# Patient Record
Sex: Female | Born: 1945 | Race: White | Hispanic: No | Marital: Married | Smoking: Never smoker
Health system: Southern US, Community
[De-identification: ages and names within clinical notes are randomized; demographics above are authoritative.]

## PROBLEM LIST (undated history)

## (undated) DIAGNOSIS — F329 Major depressive disorder, single episode, unspecified: Secondary | ICD-10-CM

## (undated) DIAGNOSIS — F32A Depression, unspecified: Secondary | ICD-10-CM

## (undated) DIAGNOSIS — Z8719 Personal history of other diseases of the digestive system: Secondary | ICD-10-CM

## (undated) DIAGNOSIS — E039 Hypothyroidism, unspecified: Secondary | ICD-10-CM

## (undated) DIAGNOSIS — K8020 Calculus of gallbladder without cholecystitis without obstruction: Secondary | ICD-10-CM

## (undated) DIAGNOSIS — K219 Gastro-esophageal reflux disease without esophagitis: Secondary | ICD-10-CM

## (undated) DIAGNOSIS — I10 Essential (primary) hypertension: Secondary | ICD-10-CM

## (undated) DIAGNOSIS — I447 Left bundle-branch block, unspecified: Secondary | ICD-10-CM

## (undated) DIAGNOSIS — F419 Anxiety disorder, unspecified: Secondary | ICD-10-CM

## (undated) HISTORY — PX: RHINOPLASTY: SHX2354

## (undated) HISTORY — PX: ANAL FISSURE REPAIR: SHX2312

---

## 1998-03-02 ENCOUNTER — Ambulatory Visit (HOSPITAL_COMMUNITY): Admission: RE | Admit: 1998-03-02 | Discharge: 1998-03-02 | Payer: Self-pay | Admitting: Obstetrics and Gynecology

## 1998-06-12 DIAGNOSIS — I447 Left bundle-branch block, unspecified: Secondary | ICD-10-CM

## 1998-06-12 HISTORY — DX: Left bundle-branch block, unspecified: I44.7

## 1998-09-20 ENCOUNTER — Emergency Department (HOSPITAL_COMMUNITY): Admission: EM | Admit: 1998-09-20 | Discharge: 1998-09-20 | Payer: Self-pay

## 1999-03-23 ENCOUNTER — Encounter: Payer: Self-pay | Admitting: General Surgery

## 1999-03-24 ENCOUNTER — Ambulatory Visit (HOSPITAL_COMMUNITY): Admission: RE | Admit: 1999-03-24 | Discharge: 1999-03-24 | Payer: Self-pay | Admitting: General Surgery

## 1999-04-21 ENCOUNTER — Encounter: Payer: Self-pay | Admitting: Internal Medicine

## 1999-04-21 ENCOUNTER — Ambulatory Visit (HOSPITAL_COMMUNITY): Admission: RE | Admit: 1999-04-21 | Discharge: 1999-04-21 | Payer: Self-pay | Admitting: Internal Medicine

## 2000-06-27 ENCOUNTER — Encounter: Payer: Self-pay | Admitting: Obstetrics and Gynecology

## 2000-06-27 ENCOUNTER — Ambulatory Visit (HOSPITAL_COMMUNITY): Admission: RE | Admit: 2000-06-27 | Discharge: 2000-06-27 | Payer: Self-pay | Admitting: Obstetrics and Gynecology

## 2001-12-11 ENCOUNTER — Encounter: Admission: RE | Admit: 2001-12-11 | Discharge: 2001-12-11 | Payer: Self-pay | Admitting: Internal Medicine

## 2001-12-11 ENCOUNTER — Encounter: Payer: Self-pay | Admitting: Internal Medicine

## 2001-12-18 ENCOUNTER — Encounter: Payer: Self-pay | Admitting: Internal Medicine

## 2001-12-18 ENCOUNTER — Ambulatory Visit (HOSPITAL_COMMUNITY): Admission: RE | Admit: 2001-12-18 | Discharge: 2001-12-18 | Payer: Self-pay | Admitting: Internal Medicine

## 2002-05-12 ENCOUNTER — Emergency Department (HOSPITAL_COMMUNITY): Admission: EM | Admit: 2002-05-12 | Discharge: 2002-05-12 | Payer: Self-pay | Admitting: Emergency Medicine

## 2002-05-12 ENCOUNTER — Encounter: Payer: Self-pay | Admitting: Emergency Medicine

## 2003-06-19 ENCOUNTER — Other Ambulatory Visit: Admission: RE | Admit: 2003-06-19 | Discharge: 2003-06-19 | Payer: Self-pay | Admitting: Internal Medicine

## 2003-06-25 ENCOUNTER — Encounter: Admission: RE | Admit: 2003-06-25 | Discharge: 2003-09-23 | Payer: Self-pay | Admitting: Internal Medicine

## 2004-02-04 ENCOUNTER — Ambulatory Visit (HOSPITAL_COMMUNITY): Admission: RE | Admit: 2004-02-04 | Discharge: 2004-02-04 | Payer: Self-pay | Admitting: Gastroenterology

## 2004-04-13 ENCOUNTER — Ambulatory Visit (HOSPITAL_COMMUNITY): Admission: RE | Admit: 2004-04-13 | Discharge: 2004-04-13 | Payer: Self-pay | Admitting: Internal Medicine

## 2006-02-07 ENCOUNTER — Other Ambulatory Visit: Admission: RE | Admit: 2006-02-07 | Discharge: 2006-02-07 | Payer: Self-pay | Admitting: Internal Medicine

## 2006-02-14 ENCOUNTER — Ambulatory Visit (HOSPITAL_COMMUNITY): Admission: RE | Admit: 2006-02-14 | Discharge: 2006-02-14 | Payer: Self-pay | Admitting: Internal Medicine

## 2007-09-05 ENCOUNTER — Other Ambulatory Visit: Admission: RE | Admit: 2007-09-05 | Discharge: 2007-09-05 | Payer: Self-pay | Admitting: Family Medicine

## 2008-03-05 ENCOUNTER — Ambulatory Visit (HOSPITAL_COMMUNITY): Admission: RE | Admit: 2008-03-05 | Discharge: 2008-03-05 | Payer: Self-pay | Admitting: Family Medicine

## 2008-03-10 ENCOUNTER — Encounter: Admission: RE | Admit: 2008-03-10 | Discharge: 2008-03-10 | Payer: Self-pay | Admitting: Family Medicine

## 2010-10-28 NOTE — Op Note (Signed)
NAME:  Crystal Owen, Crystal Owen NO.:  [id number]   MEDICAL RECORD NO.:  [id number]                   PATIENT TYPE:  AMB   LOCATION:  ENDO                                 FACILITY:  [facility]   PHYSICIAN:  Petra Kuba, M.D.                 DATE OF BIRTH:  [birth year]   DATE OF PROCEDURE:  02/04/2004  DATE OF DISCHARGE:                                 OPERATIVE REPORT   PROCEDURE:  EGD.   INDICATION:  Longstanding upper tract symptoms.  Want to rule out  significant abnormality.  Consent was signed after risks, benefits, methods,  options thoroughly discussed in the office.   MEDICINES USED:  1. Demerol 60.  2. Versed 6.   DESCRIPTION OF PROCEDURE:  The video endoscope was inserted by direct  vision.  The esophagus was normal.  In distal esophagus was a tiny hiatal  hernia.  The scope passed into the stomach, advanced through a normal  antrum, normal pylorus, into a normal duodenal bulb, and around the C-loop  to a normal second portion of the duodenum.  The scope was withdrawn back to  the bulb, and a good look there ruled out ulcers in that location.  The  scope was withdrawn back to the stomach, and retroflexed.  Angularis, fundus  were normal.  High in the cardia, the hiatal hernia was confirmed.  The  lesser and greater curve were normal on retroflexed visualization.  Straight  visualization in the stomach did not reveal any additional findings.  Air  was suctioned and scope slowly withdrawn.  Again, a good look at the  esophagus ruled out any signs of Barrett's esophagitis, etc.  The scope was  removed.  The patient tolerated the procedure well.  There was no obvious  immediate complication.   ENDOSCOPIC DIAGNOSES:  1. Tiny hiatal hernia.  2. Otherwise normal EGD.   PLAN:  1. Continue with Zantac since that seems to be helping.  2. Happy to see back p.r.n. or in 2-3 months just to recheck symptoms and     make sure no further work-up and plans are  needed.  As an aside, she is     due for colonic screening and happy to see back to set that up, or we     will discuss on follow up.                                               Petra Kuba, M.D.   MEM/MEDQ  D:  02/04/2004  T:  02/04/2004  Job:  [id number]   cc:   Darius Bump, M.D.  [place]  [place] [address]  Fax: [phone]

## 2015-06-30 DIAGNOSIS — L821 Other seborrheic keratosis: Secondary | ICD-10-CM | POA: Diagnosis not present

## 2015-06-30 DIAGNOSIS — Z23 Encounter for immunization: Secondary | ICD-10-CM | POA: Diagnosis not present

## 2015-06-30 DIAGNOSIS — L570 Actinic keratosis: Secondary | ICD-10-CM | POA: Diagnosis not present

## 2015-06-30 DIAGNOSIS — L723 Sebaceous cyst: Secondary | ICD-10-CM | POA: Diagnosis not present

## 2015-06-30 DIAGNOSIS — D225 Melanocytic nevi of trunk: Secondary | ICD-10-CM | POA: Diagnosis not present

## 2015-06-30 DIAGNOSIS — Z85828 Personal history of other malignant neoplasm of skin: Secondary | ICD-10-CM | POA: Diagnosis not present

## 2015-06-30 DIAGNOSIS — Z808 Family history of malignant neoplasm of other organs or systems: Secondary | ICD-10-CM | POA: Diagnosis not present

## 2015-08-03 DIAGNOSIS — K219 Gastro-esophageal reflux disease without esophagitis: Secondary | ICD-10-CM | POA: Diagnosis not present

## 2015-08-03 DIAGNOSIS — F411 Generalized anxiety disorder: Secondary | ICD-10-CM | POA: Diagnosis not present

## 2015-08-03 DIAGNOSIS — E039 Hypothyroidism, unspecified: Secondary | ICD-10-CM | POA: Diagnosis not present

## 2015-08-03 DIAGNOSIS — I10 Essential (primary) hypertension: Secondary | ICD-10-CM | POA: Diagnosis not present

## 2015-08-03 DIAGNOSIS — F3341 Major depressive disorder, recurrent, in partial remission: Secondary | ICD-10-CM | POA: Diagnosis not present

## 2015-08-06 DIAGNOSIS — R197 Diarrhea, unspecified: Secondary | ICD-10-CM | POA: Diagnosis not present

## 2015-08-09 DIAGNOSIS — K921 Melena: Secondary | ICD-10-CM | POA: Diagnosis not present

## 2015-08-09 DIAGNOSIS — R197 Diarrhea, unspecified: Secondary | ICD-10-CM | POA: Diagnosis not present

## 2015-08-09 DIAGNOSIS — Z8719 Personal history of other diseases of the digestive system: Secondary | ICD-10-CM | POA: Diagnosis not present

## 2015-08-26 DIAGNOSIS — K921 Melena: Secondary | ICD-10-CM | POA: Diagnosis not present

## 2015-08-26 DIAGNOSIS — Z1210 Encounter for screening for malignant neoplasm of intestinal tract, unspecified: Secondary | ICD-10-CM | POA: Diagnosis not present

## 2015-08-26 DIAGNOSIS — R1011 Right upper quadrant pain: Secondary | ICD-10-CM | POA: Diagnosis not present

## 2015-09-01 DIAGNOSIS — K5730 Diverticulosis of large intestine without perforation or abscess without bleeding: Secondary | ICD-10-CM | POA: Diagnosis not present

## 2015-09-01 DIAGNOSIS — Z1211 Encounter for screening for malignant neoplasm of colon: Secondary | ICD-10-CM | POA: Diagnosis not present

## 2015-09-14 DIAGNOSIS — H2513 Age-related nuclear cataract, bilateral: Secondary | ICD-10-CM | POA: Diagnosis not present

## 2015-09-14 DIAGNOSIS — H04123 Dry eye syndrome of bilateral lacrimal glands: Secondary | ICD-10-CM | POA: Diagnosis not present

## 2015-12-08 DIAGNOSIS — F411 Generalized anxiety disorder: Secondary | ICD-10-CM | POA: Diagnosis not present

## 2015-12-08 DIAGNOSIS — I10 Essential (primary) hypertension: Secondary | ICD-10-CM | POA: Diagnosis not present

## 2015-12-08 DIAGNOSIS — F3341 Major depressive disorder, recurrent, in partial remission: Secondary | ICD-10-CM | POA: Diagnosis not present

## 2015-12-08 DIAGNOSIS — E039 Hypothyroidism, unspecified: Secondary | ICD-10-CM | POA: Diagnosis not present

## 2015-12-08 DIAGNOSIS — K219 Gastro-esophageal reflux disease without esophagitis: Secondary | ICD-10-CM | POA: Diagnosis not present

## 2015-12-13 DIAGNOSIS — I10 Essential (primary) hypertension: Secondary | ICD-10-CM | POA: Diagnosis not present

## 2015-12-15 DIAGNOSIS — L729 Follicular cyst of the skin and subcutaneous tissue, unspecified: Secondary | ICD-10-CM | POA: Diagnosis not present

## 2016-02-07 DIAGNOSIS — Z23 Encounter for immunization: Secondary | ICD-10-CM | POA: Diagnosis not present

## 2016-02-07 DIAGNOSIS — Z0000 Encounter for general adult medical examination without abnormal findings: Secondary | ICD-10-CM | POA: Diagnosis not present

## 2016-03-23 DIAGNOSIS — R946 Abnormal results of thyroid function studies: Secondary | ICD-10-CM | POA: Diagnosis not present

## 2016-05-10 ENCOUNTER — Other Ambulatory Visit: Payer: Self-pay | Admitting: Gastroenterology

## 2016-05-10 DIAGNOSIS — K210 Gastro-esophageal reflux disease with esophagitis: Secondary | ICD-10-CM | POA: Diagnosis not present

## 2016-05-10 DIAGNOSIS — R1011 Right upper quadrant pain: Secondary | ICD-10-CM

## 2016-05-18 ENCOUNTER — Ambulatory Visit
Admission: RE | Admit: 2016-05-18 | Discharge: 2016-05-18 | Disposition: A | Discharge status: Home / Self Care | Payer: PPO | Source: Ambulatory Visit | Attending: Gastroenterology | Admitting: Gastroenterology

## 2016-05-18 DIAGNOSIS — R1011 Right upper quadrant pain: Secondary | ICD-10-CM

## 2016-05-18 DIAGNOSIS — K8020 Calculus of gallbladder without cholecystitis without obstruction: Secondary | ICD-10-CM | POA: Diagnosis not present

## 2016-05-30 DIAGNOSIS — K8020 Calculus of gallbladder without cholecystitis without obstruction: Secondary | ICD-10-CM | POA: Diagnosis not present

## 2016-06-02 ENCOUNTER — Ambulatory Visit: Payer: Self-pay | Admitting: General Surgery

## 2016-06-20 ENCOUNTER — Encounter (HOSPITAL_BASED_OUTPATIENT_CLINIC_OR_DEPARTMENT_OTHER): Payer: Self-pay | Admitting: *Deleted

## 2016-06-21 ENCOUNTER — Encounter (HOSPITAL_BASED_OUTPATIENT_CLINIC_OR_DEPARTMENT_OTHER)
Admission: RE | Admit: 2016-06-21 | Discharge: 2016-06-21 | Disposition: A | Discharge status: Home / Self Care | Payer: PPO | Source: Ambulatory Visit | Attending: General Surgery | Admitting: General Surgery

## 2016-06-21 ENCOUNTER — Encounter (HOSPITAL_BASED_OUTPATIENT_CLINIC_OR_DEPARTMENT_OTHER): Payer: Self-pay | Admitting: *Deleted

## 2016-06-21 ENCOUNTER — Other Ambulatory Visit: Payer: Self-pay

## 2016-06-21 ENCOUNTER — Other Ambulatory Visit: Payer: Self-pay | Admitting: General Surgery

## 2016-06-21 ENCOUNTER — Ambulatory Visit
Admission: RE | Admit: 2016-06-21 | Discharge: 2016-06-21 | Disposition: A | Discharge status: Home / Self Care | Payer: PPO | Source: Ambulatory Visit | Attending: General Surgery | Admitting: General Surgery

## 2016-06-21 DIAGNOSIS — K8020 Calculus of gallbladder without cholecystitis without obstruction: Secondary | ICD-10-CM | POA: Diagnosis not present

## 2016-06-21 DIAGNOSIS — Z01811 Encounter for preprocedural respiratory examination: Secondary | ICD-10-CM

## 2016-06-21 DIAGNOSIS — Z01818 Encounter for other preprocedural examination: Secondary | ICD-10-CM | POA: Insufficient documentation

## 2016-06-21 DIAGNOSIS — R05 Cough: Secondary | ICD-10-CM | POA: Diagnosis not present

## 2016-06-21 LAB — COMPREHENSIVE METABOLIC PANEL
ALBUMIN: 3.7 g/dL (ref 3.5–5.0)
ALT: 18 U/L (ref 14–54)
ANION GAP: 5 (ref 5–15)
AST: 21 U/L (ref 15–41)
Alkaline Phosphatase: 111 U/L (ref 38–126)
BUN: 10 mg/dL (ref 6–20)
CHLORIDE: 105 mmol/L (ref 101–111)
CO2: 28 mmol/L (ref 22–32)
Calcium: 9.5 mg/dL (ref 8.9–10.3)
Creatinine, Ser: 0.76 mg/dL (ref 0.44–1.00)
GFR calc non Af Amer: >60 mL/min (ref >60)
GLUCOSE: 126 mg/dL — AB (ref 65–99)
Potassium: 4.6 mmol/L (ref 3.5–5.1)
SODIUM: 138 mmol/L (ref 135–145)
Total Bilirubin: 0.6 mg/dL (ref 0.3–1.2)
Total Protein: 7.1 g/dL (ref 6.5–8.1)

## 2016-06-21 LAB — CBC WITH DIFFERENTIAL/PLATELET
BASOS ABS: 0.1 10*3/uL (ref 0.0–0.1)
BASOS PCT: 1 %
EOS ABS: 0.1 10*3/uL (ref 0.0–0.7)
EOS PCT: 2 %
HCT: 42.3 % (ref 36.0–46.0)
Hemoglobin: 13.7 g/dL (ref 12.0–15.0)
LYMPHS ABS: 1.7 10*3/uL (ref 0.7–4.0)
Lymphocytes Relative: 27 %
MCH: 29.7 pg (ref 26.0–34.0)
MCHC: 32.4 g/dL (ref 30.0–36.0)
MCV: 91.8 fL (ref 78.0–100.0)
Monocytes Absolute: 0.4 10*3/uL (ref 0.1–1.0)
Monocytes Relative: 6 %
Neutro Abs: 4 10*3/uL (ref 1.7–7.7)
Neutrophils Relative %: 64 %
PLATELETS: 336 10*3/uL (ref 150–400)
RBC: 4.61 MIL/uL (ref 3.87–5.11)
RDW: 13.7 % (ref 11.5–15.5)
WBC: 6.2 10*3/uL (ref 4.0–10.5)

## 2016-06-21 NOTE — Progress Notes (Signed)
Chart reviewed by Dr Ermalene Postin, made aware of patient hx LBBB and that she has not seen cardiologist since early 2000's. She is coming in for labs and EKG preop. She denies any chest pain, SOB, leg swelling.

## 2016-06-26 ENCOUNTER — Ambulatory Visit (HOSPITAL_BASED_OUTPATIENT_CLINIC_OR_DEPARTMENT_OTHER): Payer: PPO | Admitting: Anesthesiology

## 2016-06-26 ENCOUNTER — Encounter (HOSPITAL_BASED_OUTPATIENT_CLINIC_OR_DEPARTMENT_OTHER)
Admission: RE | Disposition: A | Discharge status: Home / Self Care | Payer: Self-pay | Source: Ambulatory Visit | Attending: General Surgery

## 2016-06-26 ENCOUNTER — Encounter (HOSPITAL_BASED_OUTPATIENT_CLINIC_OR_DEPARTMENT_OTHER): Payer: Self-pay | Admitting: Anesthesiology

## 2016-06-26 ENCOUNTER — Ambulatory Visit (HOSPITAL_BASED_OUTPATIENT_CLINIC_OR_DEPARTMENT_OTHER)
Admission: RE | Admit: 2016-06-26 | Discharge: 2016-06-26 | Disposition: A | Discharge status: Home / Self Care | Payer: PPO | Source: Ambulatory Visit | Attending: General Surgery | Admitting: General Surgery

## 2016-06-26 DIAGNOSIS — F329 Major depressive disorder, single episode, unspecified: Secondary | ICD-10-CM | POA: Diagnosis not present

## 2016-06-26 DIAGNOSIS — I110 Hypertensive heart disease with heart failure: Secondary | ICD-10-CM | POA: Insufficient documentation

## 2016-06-26 DIAGNOSIS — K8010 Calculus of gallbladder with chronic cholecystitis without obstruction: Secondary | ICD-10-CM | POA: Diagnosis not present

## 2016-06-26 DIAGNOSIS — Z79899 Other long term (current) drug therapy: Secondary | ICD-10-CM | POA: Insufficient documentation

## 2016-06-26 DIAGNOSIS — Z01818 Encounter for other preprocedural examination: Secondary | ICD-10-CM

## 2016-06-26 DIAGNOSIS — K8020 Calculus of gallbladder without cholecystitis without obstruction: Secondary | ICD-10-CM | POA: Diagnosis not present

## 2016-06-26 DIAGNOSIS — I509 Heart failure, unspecified: Secondary | ICD-10-CM | POA: Insufficient documentation

## 2016-06-26 DIAGNOSIS — K824 Cholesterolosis of gallbladder: Secondary | ICD-10-CM | POA: Diagnosis not present

## 2016-06-26 DIAGNOSIS — K219 Gastro-esophageal reflux disease without esophagitis: Secondary | ICD-10-CM | POA: Insufficient documentation

## 2016-06-26 HISTORY — DX: Depression, unspecified: F32.A

## 2016-06-26 HISTORY — DX: Gastro-esophageal reflux disease without esophagitis: K21.9

## 2016-06-26 HISTORY — DX: Hypothyroidism, unspecified: E03.9

## 2016-06-26 HISTORY — DX: Essential (primary) hypertension: I10

## 2016-06-26 HISTORY — PX: CHOLECYSTECTOMY: SHX55

## 2016-06-26 HISTORY — DX: Major depressive disorder, single episode, unspecified: F32.9

## 2016-06-26 HISTORY — DX: Personal history of other diseases of the digestive system: Z87.19

## 2016-06-26 HISTORY — DX: Anxiety disorder, unspecified: F41.9

## 2016-06-26 HISTORY — DX: Calculus of gallbladder without cholecystitis without obstruction: K80.20

## 2016-06-26 HISTORY — DX: Left bundle-branch block, unspecified: I44.7

## 2016-06-26 SURGERY — LAPAROSCOPIC CHOLECYSTECTOMY
Anesthesia: General | Site: Abdomen

## 2016-06-26 MED ORDER — FENTANYL CITRATE (PF) 100 MCG/2ML IJ SOLN
25.0000 ug | INTRAMUSCULAR | Status: DC | PRN
  Administered 2016-06-26: 25 ug via INTRAVENOUS

## 2016-06-26 MED ORDER — OXYCODONE HCL 5 MG PO TABS: 5.0000 mg | ORAL_TABLET | Freq: Four times a day (QID) | ORAL | 0 refills | Status: AC | PRN

## 2016-06-26 MED ORDER — SUGAMMADEX SODIUM 200 MG/2ML IV SOLN
INTRAVENOUS | Status: AC
  Filled 2016-06-26: qty 2

## 2016-06-26 MED ORDER — MIDAZOLAM HCL 2 MG/2ML IJ SOLN: 1.0000 mg | INTRAMUSCULAR | Status: DC | PRN

## 2016-06-26 MED ORDER — MIDAZOLAM HCL 2 MG/2ML IJ SOLN
INTRAMUSCULAR | Status: AC
  Filled 2016-06-26: qty 2

## 2016-06-26 MED ORDER — CHLORHEXIDINE GLUCONATE CLOTH 2 % EX PADS: 6.0000 | MEDICATED_PAD | Freq: Once | CUTANEOUS | Status: DC

## 2016-06-26 MED ORDER — SUGAMMADEX SODIUM 200 MG/2ML IV SOLN
INTRAVENOUS | Status: DC | PRN
  Administered 2016-06-26: 200 mg via INTRAVENOUS

## 2016-06-26 MED ORDER — ACETAMINOPHEN 500 MG PO TABS
ORAL_TABLET | ORAL | Status: AC
  Filled 2016-06-26: qty 2

## 2016-06-26 MED ORDER — BUPIVACAINE HCL (PF) 0.25 % IJ SOLN
INTRAMUSCULAR | Status: DC | PRN
  Administered 2016-06-26: 17 mL

## 2016-06-26 MED ORDER — LIDOCAINE HCL (PF) 1 % IJ SOLN
INTRAMUSCULAR | Status: AC
  Filled 2016-06-26: qty 60

## 2016-06-26 MED ORDER — DEXAMETHASONE SODIUM PHOSPHATE 4 MG/ML IJ SOLN
INTRAMUSCULAR | Status: DC | PRN
  Administered 2016-06-26: 10 mg via INTRAVENOUS

## 2016-06-26 MED ORDER — ONDANSETRON HCL 4 MG/2ML IJ SOLN
INTRAMUSCULAR | Status: AC
  Filled 2016-06-26: qty 2

## 2016-06-26 MED ORDER — FENTANYL CITRATE (PF) 100 MCG/2ML IJ SOLN
INTRAMUSCULAR | Status: AC
  Filled 2016-06-26: qty 2

## 2016-06-26 MED ORDER — FENTANYL CITRATE (PF) 100 MCG/2ML IJ SOLN
INTRAMUSCULAR | Status: DC | PRN
  Administered 2016-06-26: 100 ug via INTRAVENOUS

## 2016-06-26 MED ORDER — ROCURONIUM BROMIDE 100 MG/10ML IV SOLN
INTRAVENOUS | Status: DC | PRN
  Administered 2016-06-26: 50 mg via INTRAVENOUS

## 2016-06-26 MED ORDER — BUPIVACAINE HCL (PF) 0.25 % IJ SOLN
INTRAMUSCULAR | Status: AC
  Filled 2016-06-26: qty 60

## 2016-06-26 MED ORDER — SCOPOLAMINE 1 MG/3DAYS TD PT72: 1.0000 | MEDICATED_PATCH | Freq: Once | TRANSDERMAL | Status: DC | PRN

## 2016-06-26 MED ORDER — ACETAMINOPHEN 500 MG PO TABS
1000.0000 mg | ORAL_TABLET | ORAL | Status: AC
  Administered 2016-06-26: 1000 mg via ORAL

## 2016-06-26 MED ORDER — SODIUM CHLORIDE 0.9 % IR SOLN
Status: DC | PRN
  Administered 2016-06-26: 1000 mL

## 2016-06-26 MED ORDER — LIDOCAINE HCL (CARDIAC) 20 MG/ML IV SOLN
INTRAVENOUS | Status: DC | PRN
  Administered 2016-06-26: 30 mg via INTRAVENOUS

## 2016-06-26 MED ORDER — PROPOFOL 10 MG/ML IV BOLUS
INTRAVENOUS | Status: AC
  Filled 2016-06-26: qty 20

## 2016-06-26 MED ORDER — ROCURONIUM BROMIDE 10 MG/ML (PF) SYRINGE
PREFILLED_SYRINGE | INTRAVENOUS | Status: AC
  Filled 2016-06-26: qty 10

## 2016-06-26 MED ORDER — CEFOTETAN DISODIUM-DEXTROSE 2-2.08 GM-% IV SOLR
INTRAVENOUS | Status: AC
  Filled 2016-06-26: qty 50

## 2016-06-26 MED ORDER — ONDANSETRON HCL 4 MG/2ML IJ SOLN
INTRAMUSCULAR | Status: DC | PRN
  Administered 2016-06-26: 4 mg via INTRAVENOUS

## 2016-06-26 MED ORDER — DEXTROSE 5 % IV SOLN
2.0000 g | INTRAVENOUS | Status: AC
  Administered 2016-06-26: 2 g via INTRAVENOUS

## 2016-06-26 MED ORDER — LACTATED RINGERS IV SOLN
INTRAVENOUS | Status: DC
  Administered 2016-06-26: 08:00:00 via INTRAVENOUS
  Administered 2016-06-26: 10 mL/h via INTRAVENOUS
  Administered 2016-06-26: 09:00:00 via INTRAVENOUS

## 2016-06-26 MED ORDER — MIDAZOLAM HCL 5 MG/5ML IJ SOLN
INTRAMUSCULAR | Status: DC | PRN
  Administered 2016-06-26: 2 mg via INTRAVENOUS

## 2016-06-26 MED ORDER — POVIDONE-IODINE 10 % EX OINT
TOPICAL_OINTMENT | CUTANEOUS | Status: AC
  Filled 2016-06-26: qty 28.35

## 2016-06-26 MED ORDER — LIDOCAINE 2% (20 MG/ML) 5 ML SYRINGE
INTRAMUSCULAR | Status: AC
  Filled 2016-06-26: qty 5

## 2016-06-26 MED ORDER — BUPIVACAINE-EPINEPHRINE (PF) 0.5% -1:200000 IJ SOLN
INTRAMUSCULAR | Status: AC
  Filled 2016-06-26: qty 30

## 2016-06-26 MED ORDER — DEXAMETHASONE SODIUM PHOSPHATE 10 MG/ML IJ SOLN
INTRAMUSCULAR | Status: AC
  Filled 2016-06-26: qty 1

## 2016-06-26 MED ORDER — BUPIVACAINE HCL (PF) 0.5 % IJ SOLN
INTRAMUSCULAR | Status: AC
  Filled 2016-06-26: qty 60

## 2016-06-26 MED ORDER — KETOROLAC TROMETHAMINE 30 MG/ML IJ SOLN
INTRAMUSCULAR | Status: AC
  Filled 2016-06-26: qty 1

## 2016-06-26 MED ORDER — IOPAMIDOL (ISOVUE-300) INJECTION 61%
INTRAVENOUS | Status: AC
  Filled 2016-06-26: qty 50

## 2016-06-26 MED ORDER — PROPOFOL 10 MG/ML IV BOLUS
INTRAVENOUS | Status: DC | PRN
  Administered 2016-06-26: 120 mg via INTRAVENOUS
  Administered 2016-06-26: 30 mg via INTRAVENOUS

## 2016-06-26 MED ORDER — FENTANYL CITRATE (PF) 100 MCG/2ML IJ SOLN: 50.0000 ug | INTRAMUSCULAR | Status: DC | PRN

## 2016-06-26 SURGICAL SUPPLY — 46 items
ADH SKN CLS APL DERMABOND .7 (GAUZE/BANDAGES/DRESSINGS) ×2
APPLIER CLIP 5 13 M/L LIGAMAX5 (MISCELLANEOUS) ×4
APPLIER CLIP ROT 10 11.4 M/L (STAPLE) ×4
APR CLP MED LRG 11.4X10 (STAPLE) ×2
APR CLP MED LRG 5 ANG JAW (MISCELLANEOUS) ×2
BAG SPEC RTRVL LRG 6X4 10 (ENDOMECHANICALS) ×2
BLADE CLIPPER SURG (BLADE) IMPLANT
CHLORAPREP W/TINT 26ML (MISCELLANEOUS) ×4 IMPLANT
CLIP APPLIE 5 13 M/L LIGAMAX5 (MISCELLANEOUS) ×2 IMPLANT
CLIP APPLIE ROT 10 11.4 M/L (STAPLE) ×2 IMPLANT
CLOSURE WOUND 1/2 X4 (GAUZE/BANDAGES/DRESSINGS) ×1
COVER MAYO STAND STRL (DRAPES) ×4 IMPLANT
DECANTER SPIKE VIAL GLASS SM (MISCELLANEOUS) IMPLANT
DERMABOND ADVANCED (GAUZE/BANDAGES/DRESSINGS) ×2
DERMABOND ADVANCED .7 DNX12 (GAUZE/BANDAGES/DRESSINGS) ×2 IMPLANT
DRAPE C-ARM 42X72 X-RAY (DRAPES) ×1 IMPLANT
DRAPE LAPAROSCOPIC ABDOMINAL (DRAPES) ×4 IMPLANT
DRSG TEGADERM 2-3/8X2-3/4 SM (GAUZE/BANDAGES/DRESSINGS) ×16 IMPLANT
ELECT REM PT RETURN 9FT ADLT (ELECTROSURGICAL) ×4
ELECTRODE REM PT RTRN 9FT ADLT (ELECTROSURGICAL) ×2 IMPLANT
FILTER SMOKE EVAC LAPAROSHD (FILTER) IMPLANT
GLOVE BIOGEL PI IND STRL 8 (GLOVE) ×2 IMPLANT
GLOVE BIOGEL PI INDICATOR 8 (GLOVE) ×2
GLOVE ECLIPSE 7.5 STRL STRAW (GLOVE) ×4 IMPLANT
GOWN STRL REUS W/ TWL LRG LVL3 (GOWN DISPOSABLE) ×6 IMPLANT
GOWN STRL REUS W/TWL LRG LVL3 (GOWN DISPOSABLE) ×12
HEMOSTAT SNOW SURGICEL 2X4 (HEMOSTASIS) IMPLANT
NS IRRIG 1000ML POUR BTL (IV SOLUTION) IMPLANT
PACK BASIN DAY SURGERY FS (CUSTOM PROCEDURE TRAY) ×4 IMPLANT
POUCH SPECIMEN RETRIEVAL 10MM (ENDOMECHANICALS) ×4 IMPLANT
SCISSORS LAP 5X35 DISP (ENDOMECHANICALS) ×4 IMPLANT
SET CHOLANGIOGRAPH 5 50 .035 (SET/KITS/TRAYS/PACK) IMPLANT
SET IRRIG TUBING LAPAROSCOPIC (IRRIGATION / IRRIGATOR) ×4 IMPLANT
SLEEVE ENDOPATH XCEL 5M (ENDOMECHANICALS) ×4 IMPLANT
SLEEVE SCD COMPRESS KNEE MED (MISCELLANEOUS) ×4 IMPLANT
SPECIMEN JAR SMALL (MISCELLANEOUS) ×4 IMPLANT
STRIP CLOSURE SKIN 1/2X4 (GAUZE/BANDAGES/DRESSINGS) ×3 IMPLANT
SUT MNCRL AB 4-0 PS2 18 (SUTURE) ×4 IMPLANT
TOWEL OR 17X24 6PK STRL BLUE (TOWEL DISPOSABLE) ×4 IMPLANT
TRAY LAPAROSCOPIC (CUSTOM PROCEDURE TRAY) ×4 IMPLANT
TROCAR XCEL BLUNT TIP 100MML (ENDOMECHANICALS) ×4 IMPLANT
TROCAR XCEL NON-BLD 11X100MML (ENDOMECHANICALS) IMPLANT
TROCAR XCEL NON-BLD 5MMX100MML (ENDOMECHANICALS) ×4 IMPLANT
TUBE CONNECTING 20'X1/4 (TUBING) ×1
TUBE CONNECTING 20X1/4 (TUBING) ×2 IMPLANT
TUBING INSUFFLATION (TUBING) ×4 IMPLANT

## 2016-06-26 NOTE — Op Note (Signed)
OPERATIVE REPORT  DATE OF OPERATION: 06/26/2016  PATIENT:  Crystal Owen  71 y.o. female  PRE-OPERATIVE DIAGNOSIS:  symptomatic cholelithisis  POST-OPERATIVE DIAGNOSIS:  symptomatic cholelithisis  INDICATION(S) FOR OPERATION:  Symptomatic Cholelithiasis  FINDINGS:  Evidence of chronic cholecyswtitis  PROCEDURE:  Procedure(s): LAPAROSCOPIC CHOLECYSTECTOMY  SURGEON:  Surgeon(s): Judeth Horn, MD  ASSISTANT: None  ANESTHESIA:   general  COMPLICATIONS:  None  EBL: <20 ml  BLOOD ADMINISTERED: none  DRAINS: none   SPECIMEN:  Source of Specimen:  Gallbladder and contents  COUNTS CORRECT:  YES  PROCEDURE DETAILS: The patient was taken to the operating room and placed on the table in the supine position.  After an adequate endotracheal anesthetic was administered, the patient was prepped with Chloroprip, and then draped in the usual manner exposing the entire abdomen laterally, inferiorly and up  to the costal margins.  After a proper timeout was performed including identifying the patient and the procedure to be performed, a supraumbilical 99991111 midline incision was made using a #15 blade.  This was taken down to the fascia which was then incised with a #15 blade.  The edges of the fascia were tented up with Kocher clamps as the preperitoneal space was penetrated with a Kelly clamp into the peritoneum.  Once this was done, a pursestring suture of 0 Vicryl was passed around the fascial opening.  This was subsequently used to secure the [facility] cannula which was passed into the peritoneal cavity.  Once the [facility] cannula was in place, carbon dioxide gas was insufflated into the peritoneal cavity up to a maximal intra-abdominal pressure of 79mm Hg.The laparoscope, with attached camera and light source, was passed into the peritoneal cavity to visualize the direct insertion of two right upper quadrant 48mm cannulas, and a sup-xiphoid 45mm cannula.  Once all cannulas were in place, the  dissection was begun.  Two ratcheted graspers were attached to the dome and infundibulum of the gallbladder and retracted towards the anterior abdominal wall and the right upper quadrant.  Using cautery attached to a dissecting forceps, the peritoneum overlaying the triangle of Chalot and the hepatoduodenal triangle was dissected away exposing the cystic duct and the cystic artery.  A critical window was developed between the CBD and the cystic duct The cystic artery was clipped proximally and distally then transected.  A clip was placed on the gallbladder side of the cystic duct, then the distal cystic duct was dissected out clearly creating a clear and critical window between the gallbladder and the cystic duct and the liver.  The distal cystic duct was clipped three times then transected between the clips.  The gallbladder was then dissected out of the hepatic bed without event.  It was retrieved from the abdomen (using an EndoCatch bag) without event.  Once the gallbladder was removed, the bed was inspected for hemostasis.  Once excellent hemostasis was obtained all gas and fluids were aspirated from above the liver, then the cannulas were removed.  The supraumbilical incision was closed using the pursestring suture which was in place.  0.25% bupivicaine with epinephrine was injected at all sites.  All 22mm or greater cannula sites were close using a running subcuticular stitch of 4-0 Monocryl.  5.59mm cannula sites were closed with Dermabond only.Steri-Strips and Tagaderm were used to complete the dressings at all sites.  At this point all needle, sponge, and instrument counts were correct.The patient was awakened from anesthesia and taken to the PACU in stable condition.  Crystal Owen. Crystal Owen,  III, MD, FACS [phone] [facility]  PATIENT DISPOSITION:  PACU - hemodynamically stable.   Crystal Owen 1/15/20189:44 AM

## 2016-06-26 NOTE — Transfer of Care (Signed)
Immediate Anesthesia Transfer of Care Note  Patient: Crystal Owen  Procedure(s) Performed: Procedure(s): LAPAROSCOPIC CHOLECYSTECTOMY (N/A)  Patient Location: PACU  Anesthesia Type:General  Level of Consciousness: sedated  Airway & Oxygen Therapy: Patient Spontanous Breathing and Patient connected to face mask oxygen  Post-op Assessment: Report given to RN and Post -op Vital signs reviewed and stable  Post vital signs: Reviewed and stable  Last Vitals:  Vitals:   06/26/16 0738  BP: (!) 141/67  Pulse: 78  Resp: 18  Temp: 36.8 C    Last Pain:  Vitals:   06/26/16 0738  TempSrc: Oral         Complications: No apparent anesthesia complications

## 2016-06-26 NOTE — Anesthesia Preprocedure Evaluation (Signed)
Anesthesia Evaluation  Patient identified by MRN, date of birth, ID band Patient awake    Reviewed: Allergy & Precautions, H&P , Patient's Chart, lab work & pertinent test results, reviewed documented beta blocker date and time   Airway Mallampati: II  TM Distance: >3 FB Neck ROM: full    Dental no notable dental hx.    Pulmonary    Pulmonary exam normal breath sounds clear to auscultation       Cardiovascular hypertension,  Rhythm:regular Rate:Normal     Neuro/Psych    GI/Hepatic   Endo/Other    Renal/GU      Musculoskeletal   Abdominal   Peds  Hematology   Anesthesia Other Findings   Reproductive/Obstetrics                             Anesthesia Physical Anesthesia Plan  ASA: III  Anesthesia Plan: General   Post-op Pain Management:    Induction: Intravenous  Airway Management Planned: Oral ETT  Additional Equipment:   Intra-op Plan:   Post-operative Plan: Extubation in OR  Informed Consent: I have reviewed the patients History and Physical, chart, labs and discussed the procedure including the risks, benefits and alternatives for the proposed anesthesia with the patient or authorized representative who has indicated his/her understanding and acceptance.   Dental Advisory Given and Dental advisory given  Plan Discussed with: CRNA and Surgeon  Anesthesia Plan Comments: (  Discussed general anesthesia, including possible nausea, instrumentation of airway, sore throat,pulmonary aspiration, etc. I asked if the were any outstanding questions, or  concerns before we proceeded.)        Anesthesia Quick Evaluation

## 2016-06-26 NOTE — H&P (Signed)
CALIXTA Owen 05/30/2016 8:44 AM Location: [facility] Patient #: [id number] DOB: April 04, 1946 Married / Language: Cleophus Owen / Race: White Female   History of Present Illness Crystal Owen. [name] MD; 05/30/2016 9:22 AM) The patient is a 71 year old female who presents for evaluation of gall stones. The onset of the gallstones has been gradual (Has been going on for years, since 2010) and has been occurring in a recurrent pattern for 7 years. The course has been gradually worsening. The gallstones is described as moderate. There has been associated abdominal pain, burning pain, heartburn and shoulder pain. Precipitating factors include eating and excessive fat intake. Relieving factors include antacids and dietary changes.   Past Surgical History (Crystal Owen, [place]; 05/30/2016 8:44 AM) Anal Fissure Repair   Diagnostic Studies History (Crystal Owen, [place]; 05/30/2016 8:44 AM) Colonoscopy  within last year Mammogram  1-3 years ago Pap Smear  >5 years ago  Allergies (Crystal Owen, CMA; 05/30/2016 8:54 AM) Keflex *CEPHALOSPORINS*  Amoxicillin *PENICILLINS*   Medication History (Crystal Owen, CMA; 05/30/2016 8:55 AM) Carvedilol (12.5MG  Tablet, Oral) Active. Lisinopril (30MG  Tablet, Oral) Active. Omeprazole (20MG  Tablet DR, Oral) Active. AmLODIPine Besylate (2.5MG  Tablet, Oral) Active. Levothyroxine Sodium (50MCG Tablet, Oral) Active. Sertraline HCl (50MG  Tablet, Oral) Active. Medications Reconciled  Social History (Crystal Owen, CMA; 05/30/2016 8:44 AM) Alcohol use  Moderate alcohol use. Caffeine use  Carbonated beverages, Coffee. No drug use  Tobacco use  Never smoker.  Family History (Crystal Owen, [place]; 05/30/2016 8:44 AM) Alcohol Abuse  Father. Anesthetic complications  Sister. Breast Cancer  Mother. Cancer  Mother. Cerebrovascular Accident  Mother. Depression  Sister. Heart Disease  Sister. Hypertension  Father, Mother, Sister. Respiratory Condition   Sister.  Pregnancy / Birth History (Crystal Owen, [place]; 05/30/2016 8:44 AM) Age at menarche  50 years. Age of menopause  51-55 Contraceptive History  Oral contraceptives. Gravida  0  Other Problems (Crystal Owen, [place]; 05/30/2016 8:44 AM) Anxiety Disorder  Cholelithiasis  Congestive Heart Failure  Depression  Gastroesophageal Reflux Disease  Hemorrhoids  High blood pressure  Hypercholesterolemia  Melanoma  Migraine Headache  Other disease, cancer, significant illness     Review of Systems (Crystal Owen CMA; 05/30/2016 8:44 AM) General Not Present- Appetite Loss, Chills, Fatigue, Fever, Night Sweats, Weight Gain and Weight Loss. Skin Present- Dryness. Not Present- Change in Wart/Mole, Hives, Jaundice, New Lesions, Non-Healing Wounds, Rash and Ulcer. HEENT Present- Wears glasses/contact lenses. Not Present- Earache, Hearing Loss, Hoarseness, Nose Bleed, Oral Ulcers, Ringing in the Ears, Seasonal Allergies, Sinus Pain, Sore Throat, Visual Disturbances and Yellow Eyes. Respiratory Present- Chronic Cough. Not Present- Bloody sputum, Difficulty Breathing, Snoring and Wheezing. Breast Not Present- Breast Mass, Breast Pain, Nipple Discharge and Skin Changes. Cardiovascular Present- Difficulty Breathing Lying Down. Not Present- Chest Pain, Leg Cramps, Palpitations, Rapid Heart Rate, Shortness of Breath and Swelling of Extremities. Gastrointestinal Present- Abdominal Pain. Not Present- Bloating, Bloody Stool, Change in Bowel Habits, Chronic diarrhea, Constipation, Difficulty Swallowing, Excessive gas, Gets full quickly at meals, Hemorrhoids, Indigestion, Nausea, Rectal Pain and Vomiting. Female Genitourinary Not Present- Frequency, Nocturia, Painful Urination, Pelvic Pain and Urgency. Musculoskeletal Not Present- Back Pain, Joint Pain, Joint Stiffness, Muscle Pain, Muscle Weakness and Swelling of Extremities. Neurological Not Present- Decreased Memory, Fainting, Headaches,  Numbness, Seizures, Tingling, Tremor, Trouble walking and Weakness. Psychiatric Present- Anxiety. Not Present- Bipolar, Change in Sleep Pattern, Depression, Fearful and Frequent crying. Endocrine Present- Hot flashes. Not Present- Cold Intolerance, Excessive Hunger, Hair Changes, Heat Intolerance and New Diabetes. Hematology Not Present- Blood  Thinners, Easy Bruising, Excessive bleeding, Gland problems, HIV and Persistent Infections.  Vitals (Crystal Owen CMA; 05/30/2016 8:47 AM) 05/30/2016 8:46 AM Weight: 131.5 lb Height: 62in Body Surface Area: 1.6 m Body Mass Index: 24.05 kg/m  Temp.: 98.78F(Oral)  Pulse: 76 (Regular)  BP: 124/84 (Sitting, Left Arm, Standard) Current vital signs: P 78 BP 141/67   Physical Exam (Crystal Owen Crystal. Hulen Skains MD; 05/30/2016 9:27 AM) General Mental Status-Alert. General Appearance-Well groomed and Consistent with stated age. Orientation-Oriented X4. Build & Nutrition-Petite and Well nourished.  Head and Neck Neck -Note: No cervical adenopathy.  Note: No cervical adenopathy   Chest and Lung Exam Chest and lung exam reveals -normal excursion with symmetric chest walls, quiet, even and easy respiratory effort with no use of accessory muscles, non-tender and normal tactile fremitus and on auscultation, normal breath sounds, no adventitious sounds and normal vocal resonance.  Cardiovascular Cardiovascular examination reveals -normal heart sounds, regular rate and rhythm with no murmurs, (see Vital Signs section for blood pressure measurements), abdominal aorta auscultation reveals no bruits(No pulsatile mass. No bruit) and femoral artery auscultation bilaterally reveals normal pulses, no bruits, no thrills.  Abdomen Inspection Inspection of the abdomen reveals - No Visible peristalsis and No Hernias. Skin - Normal. Palpation/Percussion Palpation and Percussion of the abdomen reveal - Soft, Non Tender, No Rebound tenderness and No  Rigidity (guarding). Auscultation Auscultation of the abdomen reveals - Bowel sounds normal.    Assessment & Plan Crystal Owen Crystal. Crystal Hank MD; 05/30/2016 9:30 AM) SYMPTOMATIC CHOLELITHIASIS (K80.20) Story: Patient has had reflus and probably GB symptoms for the last 7 years. Recently having more symptoms with RUQ pain. Has modified diet recently with improvement. Impression: Symtpmatic cholelithiasis. She probably will have symptoms in the future, but not urgent to proceed with surgery. Has a history of LBBB but not symptoms, and normal execise tolerance. No blood thinners.  Patient likely wants surgery. She wants to wait until the beginning of the year. Current Plans;  Laparoscopic cholecystectomy without cholangiogram unless anatomy dictates need for study.  Crystal Owen. Dahlia Bailiff, MD, [place] [phone] [facility]

## 2016-06-26 NOTE — Discharge Instructions (Addendum)
Laparoscopic Cholecystectomy, Care After This sheet gives you information about how to care for yourself after your procedure. Your health care provider may also give you more specific instructions. If you have problems or questions, contact your health care provider. What can I expect after the procedure? After the procedure, it is common to have:  Pain at your incision sites. You will be given medicines to control this pain.  Mild nausea or vomiting.  Bloating and possible shoulder pain from the air-like gas that was used during the procedure. Follow these instructions at home: Incision care  Follow instructions from your health care provider about how to take care of your incisions. Make sure you:  Wash your hands with soap and water before you change your bandage (dressing). If soap and water are not available, use hand sanitizer.  Change your dressing as told by your health care provider.  Leave stitches (sutures), skin glue, or adhesive strips in place. These skin closures may need to be in place for 2 weeks or longer. If adhesive strip edges start to loosen and curl up, you may trim the loose edges. Do not remove adhesive strips completely unless your health care provider tells you to do that.  Do not take baths, swim, or use a hot tub until your health care provider approves. You may shower, though.  You may only be allowed to take sponge baths for bathing.  Check your incision area every day for signs of infection. Check for:  More redness, swelling, or pain.  More fluid or blood.  Warmth.  Pus or a bad smell. Activity  Do not drive or use heavy machinery while taking prescription pain medicine.  Do not lift anything that is heavier than 10 lb (4.5 kg) until your health care provider approves.  Do not play contact sports until your health care provider approves.  Do not drive for 24 hours if you were given a medicine to help you relax (sedative).  Rest as needed. Do  not return to work or school until your health care provider approves. General instructions  Take over-the-counter and prescription medicines only as told by your health care provider.  To prevent or treat constipation while you are taking prescription pain medicine, your health care provider may recommend that you:  Drink enough fluid to keep your urine clear or pale yellow.  Take over-the-counter or prescription medicines.  Eat foods that are high in fiber, such as fresh fruits and vegetables, whole grains, and beans.  Limit foods that are high in fat and processed sugars, such as fried and sweet foods. Contact a health care provider if:  You develop a rash.  You have more redness, swelling, or pain around your incisions.  You have more fluid or blood coming from your incisions.  Your incisions feel warm to the touch.  You have pus or a bad smell coming from your incisions.  You have a fever.  One or more of your incisions breaks open. Get help right away if:  You have trouble breathing.  You have chest pain.  You have increasing pain in your shoulders.  You faint or feel dizzy when you stand.  You have severe pain in your abdomen.  You have nausea or vomiting that lasts for more than one day.  You have leg pain. This information is not intended to replace advice given to you by your health care provider. Make sure you discuss any questions you have with your health care provider. Document  Released: 05/29/2005 Document Revised: 12/18/2015 Document Reviewed: 11/15/2015 Elsevier Interactive Patient Education  2017 [employer] [facility] Care Instructions  Activity: Get plenty of rest for the remainder of the day. A responsible adult should stay with you for 24 hours following the procedure.  For the next 24 hours, DO NOT: -Drive a car -Paediatric nurse -Drink alcoholic beverages -Take any medication unless instructed by your  physician -Make any legal decisions or sign important papers.  Meals: Start with liquid foods such as gelatin or soup. Progress to regular foods as tolerated. Avoid greasy, spicy, heavy foods. If nausea and/or vomiting occur, drink only clear liquids until the nausea and/or vomiting subsides. Call your physician if vomiting continues.  Special Instructions/Symptoms: Your throat may feel dry or sore from the anesthesia or the breathing tube placed in your throat during surgery. If this causes discomfort, gargle with warm salt water. The discomfort should disappear within 24 hours.  If you had a scopolamine patch placed behind your ear for the management of post- operative nausea and/or vomiting:  1. The medication in the patch is effective for 72 hours, after which it should be removed.  Wrap patch in a tissue and discard in the trash. Wash hands thoroughly with soap and water. 2. You may remove the patch earlier than 72 hours if you experience unpleasant side effects which may include dry mouth, dizziness or visual disturbances. 3. Avoid touching the patch. Wash your hands with soap and water after contact with the patch.

## 2016-06-26 NOTE — Anesthesia Procedure Notes (Signed)
Procedure Name: Intubation Date/Time: 06/26/2016 8:43 AM Performed by: Marrianne Mood Pre-anesthesia Checklist: Patient identified, Emergency Drugs available, Suction available, Patient being monitored and Timeout performed Patient Re-evaluated:Patient Re-evaluated prior to inductionOxygen Delivery Method: Circle system utilized Preoxygenation: Pre-oxygenation with 100% oxygen Intubation Type: IV induction Ventilation: Mask ventilation without difficulty Laryngoscope Size: Miller and 2 Grade View: Grade III Tube type: Oral Number of attempts: 2 Airway Equipment and Method: Stylet and Oral airway Placement Confirmation: ETT inserted through vocal cords under direct vision,  positive ETCO2 and breath sounds checked- equal and bilateral Secured at: 20 cm Tube secured with: Tape Dental Injury: Teeth and Oropharynx as per pre-operative assessment

## 2016-06-26 NOTE — Anesthesia Postprocedure Evaluation (Signed)
Anesthesia Post Note  Patient: Crystal Owen  Procedure(s) Performed: Procedure(s) (LRB): LAPAROSCOPIC CHOLECYSTECTOMY (N/A)  Patient location during evaluation: PACU Anesthesia Type: General Level of consciousness: awake Pain management: satisfactory to patient Vital Signs Assessment: post-procedure vital signs reviewed and stable Respiratory status: spontaneous breathing Cardiovascular status: stable Anesthetic complications: no       Last Vitals:  Vitals:   06/26/16 1030 06/26/16 1115  BP:  (!) 136/55  Pulse: 70 72  Resp: (!) 23 16  Temp:  36.4 C    Last Pain:  Vitals:   06/26/16 1115  TempSrc: Oral  PainSc: 3                  [name] EDWARD

## 2016-06-27 ENCOUNTER — Encounter (HOSPITAL_BASED_OUTPATIENT_CLINIC_OR_DEPARTMENT_OTHER): Payer: Self-pay | Admitting: General Surgery

## 2016-06-29 ENCOUNTER — Encounter (HOSPITAL_BASED_OUTPATIENT_CLINIC_OR_DEPARTMENT_OTHER): Payer: Self-pay | Admitting: General Surgery

## 2016-08-03 DIAGNOSIS — L570 Actinic keratosis: Secondary | ICD-10-CM | POA: Diagnosis not present

## 2016-08-03 DIAGNOSIS — L821 Other seborrheic keratosis: Secondary | ICD-10-CM | POA: Diagnosis not present

## 2016-08-03 DIAGNOSIS — Z23 Encounter for immunization: Secondary | ICD-10-CM | POA: Diagnosis not present

## 2016-09-05 DIAGNOSIS — L821 Other seborrheic keratosis: Secondary | ICD-10-CM | POA: Diagnosis not present

## 2016-09-05 DIAGNOSIS — D225 Melanocytic nevi of trunk: Secondary | ICD-10-CM | POA: Diagnosis not present

## 2016-09-05 DIAGNOSIS — L570 Actinic keratosis: Secondary | ICD-10-CM | POA: Diagnosis not present

## 2016-09-05 DIAGNOSIS — Z23 Encounter for immunization: Secondary | ICD-10-CM | POA: Diagnosis not present

## 2016-09-05 DIAGNOSIS — Z808 Family history of malignant neoplasm of other organs or systems: Secondary | ICD-10-CM | POA: Diagnosis not present

## 2016-09-05 DIAGNOSIS — Z85828 Personal history of other malignant neoplasm of skin: Secondary | ICD-10-CM | POA: Diagnosis not present

## 2016-10-10 DIAGNOSIS — H2513 Age-related nuclear cataract, bilateral: Secondary | ICD-10-CM | POA: Diagnosis not present

## 2016-10-10 DIAGNOSIS — H04123 Dry eye syndrome of bilateral lacrimal glands: Secondary | ICD-10-CM | POA: Diagnosis not present

## 2016-11-14 DIAGNOSIS — M5431 Sciatica, right side: Secondary | ICD-10-CM | POA: Diagnosis not present

## 2017-01-31 DIAGNOSIS — E039 Hypothyroidism, unspecified: Secondary | ICD-10-CM | POA: Diagnosis not present

## 2017-01-31 DIAGNOSIS — R5383 Other fatigue: Secondary | ICD-10-CM | POA: Diagnosis not present

## 2017-01-31 DIAGNOSIS — I10 Essential (primary) hypertension: Secondary | ICD-10-CM | POA: Diagnosis not present

## 2017-02-06 DIAGNOSIS — R7309 Other abnormal glucose: Secondary | ICD-10-CM | POA: Diagnosis not present

## 2017-02-06 DIAGNOSIS — I10 Essential (primary) hypertension: Secondary | ICD-10-CM | POA: Diagnosis not present

## 2017-02-06 DIAGNOSIS — F411 Generalized anxiety disorder: Secondary | ICD-10-CM | POA: Diagnosis not present

## 2017-02-06 DIAGNOSIS — R5382 Chronic fatigue, unspecified: Secondary | ICD-10-CM | POA: Diagnosis not present

## 2017-02-06 DIAGNOSIS — E039 Hypothyroidism, unspecified: Secondary | ICD-10-CM | POA: Diagnosis not present

## 2017-02-06 DIAGNOSIS — K219 Gastro-esophageal reflux disease without esophagitis: Secondary | ICD-10-CM | POA: Diagnosis not present

## 2017-03-02 DIAGNOSIS — I080 Rheumatic disorders of both mitral and aortic valves: Secondary | ICD-10-CM | POA: Diagnosis not present

## 2017-05-02 DIAGNOSIS — I351 Nonrheumatic aortic (valve) insufficiency: Secondary | ICD-10-CM | POA: Diagnosis not present

## 2017-05-02 DIAGNOSIS — I447 Left bundle-branch block, unspecified: Secondary | ICD-10-CM | POA: Diagnosis not present

## 2017-05-02 DIAGNOSIS — R9430 Abnormal result of cardiovascular function study, unspecified: Secondary | ICD-10-CM | POA: Diagnosis not present

## 2017-05-09 DIAGNOSIS — I447 Left bundle-branch block, unspecified: Secondary | ICD-10-CM | POA: Diagnosis not present

## 2017-06-29 DIAGNOSIS — F411 Generalized anxiety disorder: Secondary | ICD-10-CM | POA: Diagnosis not present

## 2017-06-29 DIAGNOSIS — E039 Hypothyroidism, unspecified: Secondary | ICD-10-CM | POA: Diagnosis not present

## 2017-06-29 DIAGNOSIS — I10 Essential (primary) hypertension: Secondary | ICD-10-CM | POA: Diagnosis not present

## 2017-06-29 DIAGNOSIS — K219 Gastro-esophageal reflux disease without esophagitis: Secondary | ICD-10-CM | POA: Diagnosis not present

## 2017-07-04 DIAGNOSIS — I10 Essential (primary) hypertension: Secondary | ICD-10-CM | POA: Diagnosis not present

## 2017-07-04 DIAGNOSIS — E039 Hypothyroidism, unspecified: Secondary | ICD-10-CM | POA: Diagnosis not present

## 2017-07-04 DIAGNOSIS — R7309 Other abnormal glucose: Secondary | ICD-10-CM | POA: Diagnosis not present

## 2017-07-06 DIAGNOSIS — H539 Unspecified visual disturbance: Secondary | ICD-10-CM | POA: Diagnosis not present

## 2017-07-17 DIAGNOSIS — D485 Neoplasm of uncertain behavior of skin: Secondary | ICD-10-CM | POA: Diagnosis not present

## 2017-07-17 DIAGNOSIS — Z23 Encounter for immunization: Secondary | ICD-10-CM | POA: Diagnosis not present

## 2017-07-17 DIAGNOSIS — L853 Xerosis cutis: Secondary | ICD-10-CM | POA: Diagnosis not present

## 2017-07-17 DIAGNOSIS — L820 Inflamed seborrheic keratosis: Secondary | ICD-10-CM | POA: Diagnosis not present

## 2017-07-18 DIAGNOSIS — H04123 Dry eye syndrome of bilateral lacrimal glands: Secondary | ICD-10-CM | POA: Diagnosis not present

## 2017-07-18 DIAGNOSIS — H2513 Age-related nuclear cataract, bilateral: Secondary | ICD-10-CM | POA: Diagnosis not present

## 2017-07-18 DIAGNOSIS — G43109 Migraine with aura, not intractable, without status migrainosus: Secondary | ICD-10-CM | POA: Diagnosis not present

## 2017-07-18 DIAGNOSIS — H40013 Open angle with borderline findings, low risk, bilateral: Secondary | ICD-10-CM | POA: Diagnosis not present

## 2017-07-20 DIAGNOSIS — H0100B Unspecified blepharitis left eye, upper and lower eyelids: Secondary | ICD-10-CM | POA: Diagnosis not present

## 2017-07-20 DIAGNOSIS — H0100A Unspecified blepharitis right eye, upper and lower eyelids: Secondary | ICD-10-CM | POA: Diagnosis not present

## 2017-07-20 DIAGNOSIS — H40013 Open angle with borderline findings, low risk, bilateral: Secondary | ICD-10-CM | POA: Diagnosis not present

## 2017-07-20 DIAGNOSIS — H04123 Dry eye syndrome of bilateral lacrimal glands: Secondary | ICD-10-CM | POA: Diagnosis not present

## 2017-09-05 DIAGNOSIS — Z808 Family history of malignant neoplasm of other organs or systems: Secondary | ICD-10-CM | POA: Diagnosis not present

## 2017-09-05 DIAGNOSIS — Z23 Encounter for immunization: Secondary | ICD-10-CM | POA: Diagnosis not present

## 2017-09-05 DIAGNOSIS — D225 Melanocytic nevi of trunk: Secondary | ICD-10-CM | POA: Diagnosis not present

## 2017-09-05 DIAGNOSIS — L821 Other seborrheic keratosis: Secondary | ICD-10-CM | POA: Diagnosis not present

## 2017-09-05 DIAGNOSIS — Z85828 Personal history of other malignant neoplasm of skin: Secondary | ICD-10-CM | POA: Diagnosis not present

## 2017-09-07 DIAGNOSIS — J209 Acute bronchitis, unspecified: Secondary | ICD-10-CM | POA: Diagnosis not present

## 2017-09-07 DIAGNOSIS — J302 Other seasonal allergic rhinitis: Secondary | ICD-10-CM | POA: Diagnosis not present

## 2017-10-17 DIAGNOSIS — I10 Essential (primary) hypertension: Secondary | ICD-10-CM | POA: Diagnosis not present

## 2017-10-17 DIAGNOSIS — K219 Gastro-esophageal reflux disease without esophagitis: Secondary | ICD-10-CM | POA: Diagnosis not present

## 2017-10-17 DIAGNOSIS — F411 Generalized anxiety disorder: Secondary | ICD-10-CM | POA: Diagnosis not present

## 2017-10-17 DIAGNOSIS — E039 Hypothyroidism, unspecified: Secondary | ICD-10-CM | POA: Diagnosis not present

## 2017-10-23 DIAGNOSIS — R05 Cough: Secondary | ICD-10-CM | POA: Diagnosis not present

## 2017-11-02 DIAGNOSIS — I10 Essential (primary) hypertension: Secondary | ICD-10-CM | POA: Diagnosis not present

## 2018-05-15 DIAGNOSIS — I5020 Unspecified systolic (congestive) heart failure: Secondary | ICD-10-CM | POA: Diagnosis not present

## 2018-05-15 DIAGNOSIS — I110 Hypertensive heart disease with heart failure: Secondary | ICD-10-CM | POA: Diagnosis not present

## 2018-05-15 DIAGNOSIS — R9431 Abnormal electrocardiogram [ECG] [EKG]: Secondary | ICD-10-CM | POA: Diagnosis not present

## 2018-05-15 DIAGNOSIS — I447 Left bundle-branch block, unspecified: Secondary | ICD-10-CM | POA: Diagnosis not present

## 2018-07-23 DIAGNOSIS — H40013 Open angle with borderline findings, low risk, bilateral: Secondary | ICD-10-CM | POA: Diagnosis not present

## 2018-07-23 DIAGNOSIS — H2513 Age-related nuclear cataract, bilateral: Secondary | ICD-10-CM | POA: Diagnosis not present

## 2018-07-23 DIAGNOSIS — H04123 Dry eye syndrome of bilateral lacrimal glands: Secondary | ICD-10-CM | POA: Diagnosis not present

## 2018-07-23 DIAGNOSIS — H52202 Unspecified astigmatism, left eye: Secondary | ICD-10-CM | POA: Diagnosis not present

## 2018-07-23 DIAGNOSIS — H5202 Hypermetropia, left eye: Secondary | ICD-10-CM | POA: Diagnosis not present

## 2018-07-23 DIAGNOSIS — H43813 Vitreous degeneration, bilateral: Secondary | ICD-10-CM | POA: Diagnosis not present

## 2018-07-23 DIAGNOSIS — H43393 Other vitreous opacities, bilateral: Secondary | ICD-10-CM | POA: Diagnosis not present

## 2018-07-23 DIAGNOSIS — H524 Presbyopia: Secondary | ICD-10-CM | POA: Diagnosis not present

## 2018-12-20 DIAGNOSIS — I10 Essential (primary) hypertension: Secondary | ICD-10-CM | POA: Diagnosis not present

## 2018-12-20 DIAGNOSIS — E039 Hypothyroidism, unspecified: Secondary | ICD-10-CM | POA: Diagnosis not present

## 2018-12-20 DIAGNOSIS — R7309 Other abnormal glucose: Secondary | ICD-10-CM | POA: Diagnosis not present

## 2018-12-24 DIAGNOSIS — R7303 Prediabetes: Secondary | ICD-10-CM | POA: Diagnosis not present

## 2018-12-24 DIAGNOSIS — Z23 Encounter for immunization: Secondary | ICD-10-CM | POA: Diagnosis not present

## 2018-12-24 DIAGNOSIS — Z1239 Encounter for other screening for malignant neoplasm of breast: Secondary | ICD-10-CM | POA: Diagnosis not present

## 2018-12-24 DIAGNOSIS — F3341 Major depressive disorder, recurrent, in partial remission: Secondary | ICD-10-CM | POA: Diagnosis not present

## 2018-12-24 DIAGNOSIS — Z1211 Encounter for screening for malignant neoplasm of colon: Secondary | ICD-10-CM | POA: Diagnosis not present

## 2018-12-24 DIAGNOSIS — I10 Essential (primary) hypertension: Secondary | ICD-10-CM | POA: Diagnosis not present

## 2018-12-24 DIAGNOSIS — R7309 Other abnormal glucose: Secondary | ICD-10-CM | POA: Diagnosis not present

## 2018-12-24 DIAGNOSIS — F411 Generalized anxiety disorder: Secondary | ICD-10-CM | POA: Diagnosis not present

## 2018-12-24 DIAGNOSIS — Z13820 Encounter for screening for osteoporosis: Secondary | ICD-10-CM | POA: Diagnosis not present

## 2018-12-24 DIAGNOSIS — K219 Gastro-esophageal reflux disease without esophagitis: Secondary | ICD-10-CM | POA: Diagnosis not present

## 2018-12-24 DIAGNOSIS — E039 Hypothyroidism, unspecified: Secondary | ICD-10-CM | POA: Diagnosis not present

## 2018-12-24 DIAGNOSIS — Z0000 Encounter for general adult medical examination without abnormal findings: Secondary | ICD-10-CM | POA: Diagnosis not present

## 2019-02-14 DIAGNOSIS — Z85828 Personal history of other malignant neoplasm of skin: Secondary | ICD-10-CM | POA: Diagnosis not present

## 2019-02-14 DIAGNOSIS — D225 Melanocytic nevi of trunk: Secondary | ICD-10-CM | POA: Diagnosis not present

## 2019-02-14 DIAGNOSIS — L821 Other seborrheic keratosis: Secondary | ICD-10-CM | POA: Diagnosis not present

## 2019-02-14 DIAGNOSIS — Z808 Family history of malignant neoplasm of other organs or systems: Secondary | ICD-10-CM | POA: Diagnosis not present

## 2019-02-14 DIAGNOSIS — L570 Actinic keratosis: Secondary | ICD-10-CM | POA: Diagnosis not present

## 2019-03-03 DIAGNOSIS — K219 Gastro-esophageal reflux disease without esophagitis: Secondary | ICD-10-CM | POA: Diagnosis not present

## 2019-05-21 DIAGNOSIS — I10 Essential (primary) hypertension: Secondary | ICD-10-CM | POA: Diagnosis not present

## 2019-05-21 DIAGNOSIS — I447 Left bundle-branch block, unspecified: Secondary | ICD-10-CM | POA: Diagnosis not present

## 2019-06-09 DIAGNOSIS — B029 Zoster without complications: Secondary | ICD-10-CM | POA: Diagnosis not present

## 2019-08-12 DIAGNOSIS — M65312 Trigger thumb, left thumb: Secondary | ICD-10-CM | POA: Diagnosis not present

## 2019-08-12 DIAGNOSIS — I10 Essential (primary) hypertension: Secondary | ICD-10-CM | POA: Diagnosis not present

## 2019-08-12 DIAGNOSIS — M65311 Trigger thumb, right thumb: Secondary | ICD-10-CM | POA: Diagnosis not present

## 2019-08-12 DIAGNOSIS — M79641 Pain in right hand: Secondary | ICD-10-CM | POA: Diagnosis not present

## 2019-08-20 DIAGNOSIS — I447 Left bundle-branch block, unspecified: Secondary | ICD-10-CM | POA: Diagnosis not present

## 2019-08-20 DIAGNOSIS — I429 Cardiomyopathy, unspecified: Secondary | ICD-10-CM | POA: Diagnosis not present

## 2019-08-20 DIAGNOSIS — I10 Essential (primary) hypertension: Secondary | ICD-10-CM | POA: Diagnosis not present

## 2019-08-20 DIAGNOSIS — R9430 Abnormal result of cardiovascular function study, unspecified: Secondary | ICD-10-CM | POA: Diagnosis not present

## 2019-08-22 DIAGNOSIS — E039 Hypothyroidism, unspecified: Secondary | ICD-10-CM | POA: Diagnosis not present

## 2019-08-22 DIAGNOSIS — R7303 Prediabetes: Secondary | ICD-10-CM | POA: Diagnosis not present

## 2019-08-22 DIAGNOSIS — I10 Essential (primary) hypertension: Secondary | ICD-10-CM | POA: Diagnosis not present

## 2019-08-26 DIAGNOSIS — J449 Chronic obstructive pulmonary disease, unspecified: Secondary | ICD-10-CM | POA: Diagnosis not present

## 2019-08-26 DIAGNOSIS — F411 Generalized anxiety disorder: Secondary | ICD-10-CM | POA: Diagnosis not present

## 2019-08-26 DIAGNOSIS — E039 Hypothyroidism, unspecified: Secondary | ICD-10-CM | POA: Diagnosis not present

## 2019-08-26 DIAGNOSIS — R7303 Prediabetes: Secondary | ICD-10-CM | POA: Diagnosis not present

## 2019-08-26 DIAGNOSIS — K219 Gastro-esophageal reflux disease without esophagitis: Secondary | ICD-10-CM | POA: Diagnosis not present

## 2019-08-26 DIAGNOSIS — F99 Mental disorder, not otherwise specified: Secondary | ICD-10-CM | POA: Diagnosis not present

## 2019-08-26 DIAGNOSIS — I10 Essential (primary) hypertension: Secondary | ICD-10-CM | POA: Diagnosis not present

## 2019-08-26 DIAGNOSIS — F3341 Major depressive disorder, recurrent, in partial remission: Secondary | ICD-10-CM | POA: Diagnosis not present

## 2019-08-26 DIAGNOSIS — F5105 Insomnia due to other mental disorder: Secondary | ICD-10-CM | POA: Diagnosis not present

## 2019-09-04 DIAGNOSIS — R399 Unspecified symptoms and signs involving the genitourinary system: Secondary | ICD-10-CM | POA: Diagnosis not present

## 2019-09-15 DIAGNOSIS — R102 Pelvic and perineal pain: Secondary | ICD-10-CM | POA: Diagnosis not present

## 2019-09-15 DIAGNOSIS — R350 Frequency of micturition: Secondary | ICD-10-CM | POA: Diagnosis not present

## 2019-09-15 DIAGNOSIS — J449 Chronic obstructive pulmonary disease, unspecified: Secondary | ICD-10-CM | POA: Diagnosis not present

## 2019-09-15 DIAGNOSIS — R3915 Urgency of urination: Secondary | ICD-10-CM | POA: Diagnosis not present

## 2019-09-15 DIAGNOSIS — K219 Gastro-esophageal reflux disease without esophagitis: Secondary | ICD-10-CM | POA: Diagnosis not present

## 2019-09-15 DIAGNOSIS — I10 Essential (primary) hypertension: Secondary | ICD-10-CM | POA: Diagnosis not present

## 2019-09-15 DIAGNOSIS — F5105 Insomnia due to other mental disorder: Secondary | ICD-10-CM | POA: Diagnosis not present

## 2019-09-15 DIAGNOSIS — F3341 Major depressive disorder, recurrent, in partial remission: Secondary | ICD-10-CM | POA: Diagnosis not present

## 2019-09-15 DIAGNOSIS — R5382 Chronic fatigue, unspecified: Secondary | ICD-10-CM | POA: Diagnosis not present

## 2019-09-15 DIAGNOSIS — R7303 Prediabetes: Secondary | ICD-10-CM | POA: Diagnosis not present

## 2019-09-15 DIAGNOSIS — E039 Hypothyroidism, unspecified: Secondary | ICD-10-CM | POA: Diagnosis not present

## 2019-09-15 DIAGNOSIS — F411 Generalized anxiety disorder: Secondary | ICD-10-CM | POA: Diagnosis not present

## 2019-10-02 DIAGNOSIS — R3915 Urgency of urination: Secondary | ICD-10-CM | POA: Diagnosis not present

## 2019-10-02 DIAGNOSIS — N898 Other specified noninflammatory disorders of vagina: Secondary | ICD-10-CM | POA: Diagnosis not present

## 2019-10-02 DIAGNOSIS — I10 Essential (primary) hypertension: Secondary | ICD-10-CM | POA: Diagnosis not present

## 2019-10-02 DIAGNOSIS — R3989 Other symptoms and signs involving the genitourinary system: Secondary | ICD-10-CM | POA: Diagnosis not present

## 2019-10-07 DIAGNOSIS — H04123 Dry eye syndrome of bilateral lacrimal glands: Secondary | ICD-10-CM | POA: Diagnosis not present

## 2019-10-07 DIAGNOSIS — H2513 Age-related nuclear cataract, bilateral: Secondary | ICD-10-CM | POA: Diagnosis not present

## 2019-10-07 DIAGNOSIS — E0789 Other specified disorders of thyroid: Secondary | ICD-10-CM | POA: Diagnosis not present

## 2019-10-07 DIAGNOSIS — H40013 Open angle with borderline findings, low risk, bilateral: Secondary | ICD-10-CM | POA: Diagnosis not present

## 2019-10-07 DIAGNOSIS — H5203 Hypermetropia, bilateral: Secondary | ICD-10-CM | POA: Diagnosis not present

## 2019-10-07 DIAGNOSIS — H524 Presbyopia: Secondary | ICD-10-CM | POA: Diagnosis not present

## 2019-10-07 DIAGNOSIS — H52202 Unspecified astigmatism, left eye: Secondary | ICD-10-CM | POA: Diagnosis not present

## 2019-10-07 DIAGNOSIS — H43813 Vitreous degeneration, bilateral: Secondary | ICD-10-CM | POA: Diagnosis not present

## 2020-01-30 DIAGNOSIS — R7303 Prediabetes: Secondary | ICD-10-CM | POA: Diagnosis not present

## 2020-01-30 DIAGNOSIS — I10 Essential (primary) hypertension: Secondary | ICD-10-CM | POA: Diagnosis not present

## 2020-01-30 DIAGNOSIS — F411 Generalized anxiety disorder: Secondary | ICD-10-CM | POA: Diagnosis not present

## 2020-01-30 DIAGNOSIS — F3341 Major depressive disorder, recurrent, in partial remission: Secondary | ICD-10-CM | POA: Diagnosis not present

## 2020-01-30 DIAGNOSIS — F99 Mental disorder, not otherwise specified: Secondary | ICD-10-CM | POA: Diagnosis not present

## 2020-01-30 DIAGNOSIS — K219 Gastro-esophageal reflux disease without esophagitis: Secondary | ICD-10-CM | POA: Diagnosis not present

## 2020-01-30 DIAGNOSIS — J301 Allergic rhinitis due to pollen: Secondary | ICD-10-CM | POA: Diagnosis not present

## 2020-01-30 DIAGNOSIS — Z8719 Personal history of other diseases of the digestive system: Secondary | ICD-10-CM | POA: Diagnosis not present

## 2020-01-30 DIAGNOSIS — R1010 Upper abdominal pain, unspecified: Secondary | ICD-10-CM | POA: Diagnosis not present

## 2020-01-30 DIAGNOSIS — E039 Hypothyroidism, unspecified: Secondary | ICD-10-CM | POA: Diagnosis not present

## 2020-01-30 DIAGNOSIS — J449 Chronic obstructive pulmonary disease, unspecified: Secondary | ICD-10-CM | POA: Diagnosis not present

## 2020-01-30 DIAGNOSIS — F5105 Insomnia due to other mental disorder: Secondary | ICD-10-CM | POA: Diagnosis not present

## 2020-02-02 DIAGNOSIS — E039 Hypothyroidism, unspecified: Secondary | ICD-10-CM | POA: Diagnosis not present

## 2020-02-02 DIAGNOSIS — I10 Essential (primary) hypertension: Secondary | ICD-10-CM | POA: Diagnosis not present

## 2020-02-02 DIAGNOSIS — R7303 Prediabetes: Secondary | ICD-10-CM | POA: Diagnosis not present

## 2020-02-10 DIAGNOSIS — M5136 Other intervertebral disc degeneration, lumbar region: Secondary | ICD-10-CM | POA: Diagnosis not present

## 2020-02-10 DIAGNOSIS — N281 Cyst of kidney, acquired: Secondary | ICD-10-CM | POA: Diagnosis not present

## 2020-02-10 DIAGNOSIS — M47816 Spondylosis without myelopathy or radiculopathy, lumbar region: Secondary | ICD-10-CM | POA: Diagnosis not present

## 2020-02-10 DIAGNOSIS — K449 Diaphragmatic hernia without obstruction or gangrene: Secondary | ICD-10-CM | POA: Diagnosis not present

## 2020-02-10 DIAGNOSIS — I700 Atherosclerosis of aorta: Secondary | ICD-10-CM | POA: Diagnosis not present

## 2020-03-09 DIAGNOSIS — R1011 Right upper quadrant pain: Secondary | ICD-10-CM | POA: Diagnosis not present

## 2020-03-09 DIAGNOSIS — K2100 Gastro-esophageal reflux disease with esophagitis, without bleeding: Secondary | ICD-10-CM | POA: Diagnosis not present

## 2020-03-10 DIAGNOSIS — I10 Essential (primary) hypertension: Secondary | ICD-10-CM | POA: Diagnosis not present

## 2020-03-10 DIAGNOSIS — I429 Cardiomyopathy, unspecified: Secondary | ICD-10-CM | POA: Diagnosis not present

## 2020-03-10 DIAGNOSIS — I447 Left bundle-branch block, unspecified: Secondary | ICD-10-CM | POA: Diagnosis not present

## 2020-03-10 DIAGNOSIS — R9430 Abnormal result of cardiovascular function study, unspecified: Secondary | ICD-10-CM | POA: Diagnosis not present

## 2020-03-12 DIAGNOSIS — N281 Cyst of kidney, acquired: Secondary | ICD-10-CM | POA: Diagnosis not present

## 2020-04-19 DIAGNOSIS — K208 Other esophagitis without bleeding: Secondary | ICD-10-CM | POA: Diagnosis not present

## 2020-04-19 DIAGNOSIS — R1011 Right upper quadrant pain: Secondary | ICD-10-CM | POA: Diagnosis not present

## 2020-08-10 DIAGNOSIS — M65311 Trigger thumb, right thumb: Secondary | ICD-10-CM | POA: Diagnosis not present

## 2020-08-10 DIAGNOSIS — M1811 Unilateral primary osteoarthritis of first carpometacarpal joint, right hand: Secondary | ICD-10-CM | POA: Diagnosis not present

## 2020-08-10 DIAGNOSIS — I10 Essential (primary) hypertension: Secondary | ICD-10-CM | POA: Diagnosis not present

## 2020-09-10 DIAGNOSIS — L821 Other seborrheic keratosis: Secondary | ICD-10-CM | POA: Diagnosis not present

## 2020-09-10 DIAGNOSIS — Z808 Family history of malignant neoplasm of other organs or systems: Secondary | ICD-10-CM | POA: Diagnosis not present

## 2020-09-10 DIAGNOSIS — L578 Other skin changes due to chronic exposure to nonionizing radiation: Secondary | ICD-10-CM | POA: Diagnosis not present

## 2020-09-10 DIAGNOSIS — Z85828 Personal history of other malignant neoplasm of skin: Secondary | ICD-10-CM | POA: Diagnosis not present

## 2020-09-10 DIAGNOSIS — B351 Tinea unguium: Secondary | ICD-10-CM | POA: Diagnosis not present

## 2020-09-10 DIAGNOSIS — D225 Melanocytic nevi of trunk: Secondary | ICD-10-CM | POA: Diagnosis not present

## 2020-09-24 DIAGNOSIS — J449 Chronic obstructive pulmonary disease, unspecified: Secondary | ICD-10-CM | POA: Diagnosis not present

## 2020-09-24 DIAGNOSIS — R7303 Prediabetes: Secondary | ICD-10-CM | POA: Diagnosis not present

## 2020-09-24 DIAGNOSIS — E039 Hypothyroidism, unspecified: Secondary | ICD-10-CM | POA: Diagnosis not present

## 2020-09-24 DIAGNOSIS — I10 Essential (primary) hypertension: Secondary | ICD-10-CM | POA: Diagnosis not present

## 2020-09-27 DIAGNOSIS — F5105 Insomnia due to other mental disorder: Secondary | ICD-10-CM | POA: Diagnosis not present

## 2020-09-27 DIAGNOSIS — J449 Chronic obstructive pulmonary disease, unspecified: Secondary | ICD-10-CM | POA: Diagnosis not present

## 2020-09-27 DIAGNOSIS — F3341 Major depressive disorder, recurrent, in partial remission: Secondary | ICD-10-CM | POA: Diagnosis not present

## 2020-09-27 DIAGNOSIS — R7303 Prediabetes: Secondary | ICD-10-CM | POA: Diagnosis not present

## 2020-09-27 DIAGNOSIS — F411 Generalized anxiety disorder: Secondary | ICD-10-CM | POA: Diagnosis not present

## 2020-09-27 DIAGNOSIS — E039 Hypothyroidism, unspecified: Secondary | ICD-10-CM | POA: Diagnosis not present

## 2020-09-27 DIAGNOSIS — K219 Gastro-esophageal reflux disease without esophagitis: Secondary | ICD-10-CM | POA: Diagnosis not present

## 2020-09-27 DIAGNOSIS — I10 Essential (primary) hypertension: Secondary | ICD-10-CM | POA: Diagnosis not present

## 2020-09-27 DIAGNOSIS — R5382 Chronic fatigue, unspecified: Secondary | ICD-10-CM | POA: Diagnosis not present

## 2020-09-27 DIAGNOSIS — J301 Allergic rhinitis due to pollen: Secondary | ICD-10-CM | POA: Diagnosis not present

## 2020-09-27 DIAGNOSIS — F99 Mental disorder, not otherwise specified: Secondary | ICD-10-CM | POA: Diagnosis not present

## 2020-10-13 DIAGNOSIS — H5203 Hypermetropia, bilateral: Secondary | ICD-10-CM | POA: Diagnosis not present

## 2020-10-13 DIAGNOSIS — H524 Presbyopia: Secondary | ICD-10-CM | POA: Diagnosis not present

## 2020-10-13 DIAGNOSIS — H40013 Open angle with borderline findings, low risk, bilateral: Secondary | ICD-10-CM | POA: Diagnosis not present

## 2020-10-13 DIAGNOSIS — H52203 Unspecified astigmatism, bilateral: Secondary | ICD-10-CM | POA: Diagnosis not present

## 2020-10-13 DIAGNOSIS — H43393 Other vitreous opacities, bilateral: Secondary | ICD-10-CM | POA: Diagnosis not present

## 2020-10-13 DIAGNOSIS — H2513 Age-related nuclear cataract, bilateral: Secondary | ICD-10-CM | POA: Diagnosis not present

## 2020-10-13 DIAGNOSIS — H43813 Vitreous degeneration, bilateral: Secondary | ICD-10-CM | POA: Diagnosis not present

## 2020-10-13 DIAGNOSIS — H04123 Dry eye syndrome of bilateral lacrimal glands: Secondary | ICD-10-CM | POA: Diagnosis not present

## 2021-03-21 DIAGNOSIS — F3341 Major depressive disorder, recurrent, in partial remission: Secondary | ICD-10-CM | POA: Diagnosis not present

## 2021-03-21 DIAGNOSIS — E559 Vitamin D deficiency, unspecified: Secondary | ICD-10-CM | POA: Diagnosis not present

## 2021-03-21 DIAGNOSIS — R7303 Prediabetes: Secondary | ICD-10-CM | POA: Diagnosis not present

## 2021-03-21 DIAGNOSIS — F5105 Insomnia due to other mental disorder: Secondary | ICD-10-CM | POA: Diagnosis not present

## 2021-03-21 DIAGNOSIS — K219 Gastro-esophageal reflux disease without esophagitis: Secondary | ICD-10-CM | POA: Diagnosis not present

## 2021-03-21 DIAGNOSIS — J301 Allergic rhinitis due to pollen: Secondary | ICD-10-CM | POA: Diagnosis not present

## 2021-03-21 DIAGNOSIS — F411 Generalized anxiety disorder: Secondary | ICD-10-CM | POA: Diagnosis not present

## 2021-03-21 DIAGNOSIS — E039 Hypothyroidism, unspecified: Secondary | ICD-10-CM | POA: Diagnosis not present

## 2021-03-21 DIAGNOSIS — F99 Mental disorder, not otherwise specified: Secondary | ICD-10-CM | POA: Diagnosis not present

## 2021-03-21 DIAGNOSIS — I10 Essential (primary) hypertension: Secondary | ICD-10-CM | POA: Diagnosis not present

## 2021-05-02 DIAGNOSIS — E039 Hypothyroidism, unspecified: Secondary | ICD-10-CM | POA: Diagnosis not present

## 2021-05-04 DIAGNOSIS — I447 Left bundle-branch block, unspecified: Secondary | ICD-10-CM | POA: Diagnosis not present

## 2021-05-04 DIAGNOSIS — I10 Essential (primary) hypertension: Secondary | ICD-10-CM | POA: Diagnosis not present

## 2021-05-31 DIAGNOSIS — I10 Essential (primary) hypertension: Secondary | ICD-10-CM | POA: Diagnosis not present

## 2021-05-31 DIAGNOSIS — I208 Other forms of angina pectoris: Secondary | ICD-10-CM | POA: Diagnosis not present
